# Patient Record
Sex: Female | Born: 1995 | Race: Black or African American | Hispanic: No | Marital: Single | Smoking: Former smoker
Health system: Southern US, Community
[De-identification: ages and names within clinical notes are randomized; demographics above are authoritative.]

## PROBLEM LIST (undated history)

## (undated) DIAGNOSIS — E079 Disorder of thyroid, unspecified: Secondary | ICD-10-CM

---
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
## 2015-09-10 ENCOUNTER — Emergency Department
Admission: EM | Admit: 2015-09-10 | Discharge: 2015-09-10 | Disposition: A | Discharge status: Home / Self Care | Payer: BLUE CROSS/BLUE SHIELD | Attending: Emergency Medicine | Admitting: Emergency Medicine

## 2015-09-10 ENCOUNTER — Encounter: Payer: Self-pay | Admitting: Emergency Medicine

## 2015-09-10 DIAGNOSIS — Y92410 Unspecified street and highway as the place of occurrence of the external cause: Secondary | ICD-10-CM | POA: Diagnosis not present

## 2015-09-10 DIAGNOSIS — F17200 Nicotine dependence, unspecified, uncomplicated: Secondary | ICD-10-CM | POA: Insufficient documentation

## 2015-09-10 DIAGNOSIS — Y998 Other external cause status: Secondary | ICD-10-CM | POA: Diagnosis not present

## 2015-09-10 DIAGNOSIS — Y9389 Activity, other specified: Secondary | ICD-10-CM | POA: Diagnosis not present

## 2015-09-10 DIAGNOSIS — S139XXA Sprain of joints and ligaments of unspecified parts of neck, initial encounter: Secondary | ICD-10-CM

## 2015-09-10 DIAGNOSIS — S134XXA Sprain of ligaments of cervical spine, initial encounter: Secondary | ICD-10-CM | POA: Diagnosis not present

## 2015-09-10 DIAGNOSIS — S199XXA Unspecified injury of neck, initial encounter: Secondary | ICD-10-CM | POA: Diagnosis present

## 2015-09-10 HISTORY — DX: Disorder of thyroid, unspecified: E07.9

## 2015-09-10 NOTE — Discharge Instructions (Signed)
Cervical Sprain  A cervical sprain is an injury in the neck in which the strong, fibrous tissues (ligaments) that connect your neck bones stretch or tear. Cervical sprains can range from mild to severe. Severe cervical sprains can cause the neck vertebrae to be unstable. This can lead to damage of the spinal cord and can result in serious nervous system problems. The amount of time it takes for a cervical sprain to get better depends on the cause and extent of the injury. Most cervical sprains heal in 1 to 3 weeks.  CAUSES   Severe cervical sprains may be caused by:    Contact sport injuries (such as from football, rugby, wrestling, hockey, auto racing, gymnastics, diving, martial arts, or boxing).    Motor vehicle collisions.    Whiplash injuries. This is an injury from a sudden forward and backward whipping movement of the head and neck.   Falls.   Mild cervical sprains may be caused by:    Being in an awkward position, such as while cradling a telephone between your ear and shoulder.    Sitting in a chair that does not offer proper support.    Working at a poorly designed computer station.    Looking up or down for long periods of time.   SYMPTOMS    Pain, soreness, stiffness, or a burning sensation in the front, back, or sides of the neck. This discomfort may develop immediately after the injury or slowly, 24 hours or more after the injury.    Pain or tenderness directly in the middle of the back of the neck.    Shoulder or upper back pain.    Limited ability to move the neck.    Headache.    Dizziness.    Weakness, numbness, or tingling in the hands or arms.    Muscle spasms.    Difficulty swallowing or chewing.    Tenderness and swelling of the neck.   DIAGNOSIS   Most of the time your health care provider can diagnose a cervical sprain by taking your history and doing a physical exam. Your health care provider will ask about previous neck injuries and any known neck  problems, such as arthritis in the neck. X-rays may be taken to find out if there are any other problems, such as with the bones of the neck. Other tests, such as a CT scan or MRI, may also be needed.   TREATMENT   Treatment depends on the severity of the cervical sprain. Mild sprains can be treated with rest, keeping the neck in place (immobilization), and pain medicines. Severe cervical sprains are immediately immobilized. Further treatment is done to help with pain, muscle spasms, and other symptoms and may include:   Medicines, such as pain relievers, numbing medicines, or muscle relaxants.    Physical therapy. This may involve stretching exercises, strengthening exercises, and posture training. Exercises and improved posture can help stabilize the neck, strengthen muscles, and help stop symptoms from returning.   HOME CARE INSTRUCTIONS    Put ice on the injured area.     Put ice in a plastic bag.     Place a towel between your skin and the bag.     Leave the ice on for 15-20 minutes, 3-4 times a day.    If your injury was severe, you may have been given a cervical collar to wear. A cervical collar is a two-piece collar designed to keep your neck from moving while it heals.      Do not remove the collar unless instructed by your health care provider.    If you have long hair, keep it outside of the collar.    Ask your health care provider before making any adjustments to your collar. Minor adjustments may be required over time to improve comfort and reduce pressure on your chin or on the back of your head.    Ifyou are allowed to remove the collar for cleaning or bathing, follow your health care provider's instructions on how to do so safely.    Keep your collar clean by wiping it with mild soap and water and drying it completely. If the collar you have been given includes removable pads, remove them every 1-2 days and hand wash them with soap and water. Allow them to air dry. They should be completely  dry before you wear them in the collar.    If you are allowed to remove the collar for cleaning and bathing, wash and dry the skin of your neck. Check your skin for irritation or sores. If you see any, tell your health care provider.    Do not drive while wearing the collar.    Only take over-the-counter or prescription medicines for pain, discomfort, or fever as directed by your health care provider.    Keep all follow-up appointments as directed by your health care provider.    Keep all physical therapy appointments as directed by your health care provider.    Make any needed adjustments to your workstation to promote good posture.    Avoid positions and activities that make your symptoms worse.    Warm up and stretch before being active to help prevent problems.   SEEK MEDICAL CARE IF:    Your pain is not controlled with medicine.    You are unable to decrease your pain medicine over time as planned.    Your activity level is not improving as expected.   SEEK IMMEDIATE MEDICAL CARE IF:    You develop any bleeding.   You develop stomach upset.   You have signs of an allergic reaction to your medicine.    Your symptoms get worse.    You develop new, unexplained symptoms.    You have numbness, tingling, weakness, or paralysis in any part of your body.   MAKE SURE YOU:    Understand these instructions.   Will watch your condition.   Will get help right away if you are not doing well or get worse.     This information is not intended to replace advice given to you by your health care provider. Make sure you discuss any questions you have with your health care provider.     Document Released: 03/26/2007 Document Revised: 06/03/2013 Document Reviewed: 12/04/2012  Elsevier Interactive Patient Education 2016 [employer]
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
## 2018-01-12 ENCOUNTER — Emergency Department: Payer: Self-pay

## 2018-01-12 ENCOUNTER — Emergency Department
Admission: EM | Admit: 2018-01-12 | Discharge: 2018-01-12 | Disposition: A | Discharge status: Home / Self Care | Payer: Self-pay | Attending: Emergency Medicine | Admitting: Emergency Medicine

## 2018-01-12 ENCOUNTER — Encounter: Payer: Self-pay | Admitting: Emergency Medicine

## 2018-01-12 ENCOUNTER — Other Ambulatory Visit: Payer: Self-pay

## 2018-01-12 DIAGNOSIS — Z87891 Personal history of nicotine dependence: Secondary | ICD-10-CM | POA: Insufficient documentation

## 2018-01-12 DIAGNOSIS — E079 Disorder of thyroid, unspecified: Secondary | ICD-10-CM | POA: Insufficient documentation

## 2018-01-12 DIAGNOSIS — R42 Dizziness and giddiness: Secondary | ICD-10-CM | POA: Insufficient documentation

## 2018-01-12 DIAGNOSIS — R569 Unspecified convulsions: Secondary | ICD-10-CM | POA: Insufficient documentation

## 2018-01-12 DIAGNOSIS — F1210 Cannabis abuse, uncomplicated: Secondary | ICD-10-CM | POA: Insufficient documentation

## 2018-01-12 DIAGNOSIS — Z79899 Other long term (current) drug therapy: Secondary | ICD-10-CM | POA: Insufficient documentation

## 2018-01-12 DIAGNOSIS — R1110 Vomiting, unspecified: Secondary | ICD-10-CM | POA: Insufficient documentation

## 2018-01-12 LAB — URINALYSIS, COMPLETE (UACMP) WITH MICROSCOPIC
BACTERIA UA: NONE SEEN
BILIRUBIN URINE: NEGATIVE
Glucose, UA: NEGATIVE mg/dL
HGB URINE DIPSTICK: NEGATIVE
KETONES UR: NEGATIVE mg/dL
NITRITE: NEGATIVE
PROTEIN: NEGATIVE mg/dL
Specific Gravity, Urine: 1.024 (ref 1.005–1.030)
pH: 5 (ref 5.0–8.0)

## 2018-01-12 LAB — BASIC METABOLIC PANEL
Anion gap: 6 (ref 5–15)
BUN: 13 mg/dL (ref 6–20)
CO2: 22 mmol/L (ref 22–32)
Calcium: 8.1 mg/dL — ABNORMAL LOW (ref 8.9–10.3)
Chloride: 113 mmol/L — ABNORMAL HIGH (ref 98–111)
Creatinine, Ser: 0.99 mg/dL (ref 0.44–1.00)
GFR calc Af Amer: >60 mL/min (ref >60)
GFR calc non Af Amer: >60 mL/min (ref >60)
GLUCOSE: 120 mg/dL — AB (ref 70–99)
POTASSIUM: 3.8 mmol/L (ref 3.5–5.1)
Sodium: 141 mmol/L (ref 135–145)

## 2018-01-12 LAB — CBC
HCT: 37 % (ref 35.0–47.0)
Hemoglobin: 11.6 g/dL — ABNORMAL LOW (ref 12.0–16.0)
MCH: 22.5 pg — ABNORMAL LOW (ref 26.0–34.0)
MCHC: 31.3 g/dL — ABNORMAL LOW (ref 32.0–36.0)
MCV: 71.9 fL — ABNORMAL LOW (ref 80.0–100.0)
Platelets: 374 10*3/uL (ref 150–440)
RBC: 5.15 MIL/uL (ref 3.80–5.20)
RDW: 16.9 % — ABNORMAL HIGH (ref 11.5–14.5)
WBC: 12.6 10*3/uL — ABNORMAL HIGH (ref 3.6–11.0)

## 2018-01-12 LAB — TSH: TSH: 9.479 u[IU]/mL — AB (ref 0.350–4.500)

## 2018-01-12 LAB — URINE DRUG SCREEN, QUALITATIVE (ARMC ONLY)
Amphetamines, Ur Screen: NOT DETECTED
Barbiturates, Ur Screen: NOT DETECTED
Cannabinoid 50 Ng, Ur ~~LOC~~: POSITIVE — AB
Cocaine Metabolite,Ur ~~LOC~~: NOT DETECTED
MDMA (ECSTASY) UR SCREEN: NOT DETECTED
Methadone Scn, Ur: NOT DETECTED
Opiate, Ur Screen: NOT DETECTED
Phencyclidine (PCP) Ur S: NOT DETECTED
Tricyclic, Ur Screen: NOT DETECTED

## 2018-01-12 LAB — POC URINE PREG, ED: Preg Test, Ur: NEGATIVE

## 2018-01-12 LAB — TROPONIN I: Troponin I: <0.03 ng/mL (ref <0.03)

## 2018-01-12 MED ORDER — SODIUM CHLORIDE 0.9 % IV BOLUS: 1000.0000 mL | Freq: Once | INTRAVENOUS | Status: DC

## 2018-01-12 NOTE — ED Triage Notes (Signed)
Pt BIB [facility] from friends house for seizure and vomiting. Pt states that she donoted plasma this morning around 1130, went to a friends house and smokes weed. Pt's friends reported to EMS that pt vomited and then her eyes rolled in the back of her head and postured and was jerking. EMS denies that pt was post ictal upon their arrival but states that pt was diaphoretic. Pt is currently A & O x 4, pt denies any current complaints. Pt denies hx/o seizures.   Vital signs with EMS as follows   Blood Pressure- 100/70 HR- 76 CBG- 131  SpO2- 99% on RA

## 2018-01-12 NOTE — ED Provider Notes (Signed)
[facility] Emergency Department Provider Note ____________________________________________   First MD Initiated Contact with Patient 01/12/18 1506     (approximate)  I have reviewed the triage vital signs and the nursing notes.   HISTORY  Chief Complaint Seizures  HPI Marilyn Peterson is a 22 y.o. female with a history of thyroid disease as well as regular marijuana use was presented to the emergency department after convulsive episodes x2 after donating plasma this morning.  The patient says that she donated plasma this morning and is smoked marijuana thereafter.  She says that she has been eating and drinking normally lately.  Says that she has donated plasma before and is gotten dizzy but is never passed out or had a seizure.  No family history of seizure disorder.  Patient has not had any alcoholic drinks in the past 24 hours.  Patient states that she began to feel dizzy and then a friend who was present said that the patient went limp and then had generalized tonic-clonic activity for about 2 minutes.  Patient then regained consciousness and then had another 2 minutes of generalized tonic-clonic activity.  The patient did not have any blood from her mouth.  Bystanders said that it looks like her eyes were rolling back.  Patient did not lose bowel or bladder continence.  Says that she has mild pressure to the front of her head at this time but no other complaints.  Does not report any weakness or numbness.  Bystanders also said that the witness the patient vomiting before she sees the first time.   Past Medical History:  Diagnosis Date  . Thyroid disease     There are no active problems to display for this patient.   History reviewed. No pertinent surgical history.  Prior to Admission medications   Medication Sig Start Date End Date Taking? Authorizing Provider  levothyroxine (SYNTHROID, LEVOTHROID) 100 MCG tablet Take 100 mcg by mouth daily. 12/06/16    [provider]    Allergies Patient has no known allergies.  No family history on file.  Social History Social History   Tobacco Use  . Smoking status: Former Games developermoker  . Smokeless tobacco: Never Used  Substance Use Topics  . Alcohol use: Yes    Alcohol/week: 1.2 oz    Types: 2 Shots of liquor per week  . Drug use: Yes    Types: Marijuana    Review of Systems  Constitutional: No fever/chills Eyes: No visual changes. ENT: No sore throat. Cardiovascular: Denies chest pain. Respiratory: Denies shortness of breath. Gastrointestinal: No abdominal pain.  No diarrhea.  No constipation. Genitourinary: Negative for dysuria. Musculoskeletal: Negative for back pain. Skin: Negative for rash. Neurological: Negative for focal weakness or numbness.   ____________________________________________   PHYSICAL EXAM:  VITAL SIGNS: ED Triage Vitals  Enc Vitals Group     BP 01/12/18 1438 110/81     Pulse Rate 01/12/18 1438 88     Resp 01/12/18 1438 19     Temp 01/12/18 1438 98.5 F (36.9 C)     Temp Source 01/12/18 1438 Oral     SpO2 01/12/18 1438 98 %     Weight 01/12/18 1440 297 lb (134.7 kg)     Height 01/12/18 1440 5\' 7"  (1.702 m)     Head Circumference --      Peak Flow --      Pain Score 01/12/18 1440 0     Pain Loc --  Pain Edu? --      Excl. in GC? --     Constitutional: Alert and oriented. Well appearing and in no acute distress. Eyes: Conjunctivae are normal.  Head: Atraumatic. Nose: No congestion/rhinnorhea. Mouth/Throat: Mucous membranes are moist.  Patient appears to have bilateral tongue abrasions.  No active bleeding.  Abrasions are superficial.  No exudate or swelling.  No erythema. Neck: No stridor.   Cardiovascular: Normal rate, regular rhythm. Grossly normal heart sounds. Respiratory: Normal respiratory effort.  No retractions. Lungs CTAB. Gastrointestinal: Soft and nontender. No distention.  Musculoskeletal: No lower extremity tenderness  nor edema.  No joint effusions. Neurologic:  Normal speech and language. No gross focal neurologic deficits are appreciated. Skin:  Skin is warm, dry and intact. No rash noted. Psychiatric: Mood and affect are normal. Speech and behavior are normal.  ____________________________________________   LABS (all labs ordered are listed, but only abnormal results are displayed)  Labs Reviewed  BASIC METABOLIC PANEL - Abnormal; Notable for the following components:      Result Value   Chloride 113 (*)    Glucose, Bld 120 (*)    Calcium 8.1 (*)    All other components within normal limits  CBC - Abnormal; Notable for the following components:   WBC 12.6 (*)    Hemoglobin 11.6 (*)    MCV 71.9 (*)    MCH 22.5 (*)    MCHC 31.3 (*)    RDW 16.9 (*)    All other components within normal limits  URINE DRUG SCREEN, QUALITATIVE ([facility] ONLY) - Abnormal; Notable for the following components:   Cannabinoid 50 Ng, Ur [place] POSITIVE (*)    Benzodiazepine, Ur Scrn TEST NOT PERFORMED, REAGENT NOT AVAILABLE (*)    All other components within normal limits  URINALYSIS, COMPLETE (UACMP) WITH MICROSCOPIC - Abnormal; Notable for the following components:   Color, Urine YELLOW (*)    APPearance HAZY (*)    Leukocytes, UA TRACE (*)    All other components within normal limits  TSH - Abnormal; Notable for the following components:   TSH 9.479 (*)    All other components within normal limits  TROPONIN I  POC URINE PREG, ED  CBG MONITORING, ED   ____________________________________________  EKG  ED ECG REPORT I, Arelia Longest, the attending physician, personally viewed and interpreted this ECG.   Date: 01/12/2018  EKG Time: 1438  Rate: 87  Rhythm: normal sinus rhythm  Axis: Normal  Intervals:none  ST&T Change: No ST segment elevation or depression.  No abnormal T wave inversion.  ____________________________________________  RADIOLOGY  CT scan without any acute  abnormality. ____________________________________________   PROCEDURES  Procedure(s) performed:   Procedures  Critical Care performed:   ____________________________________________   INITIAL IMPRESSION / ASSESSMENT AND PLAN / ED COURSE  Pertinent labs & imaging results that were available during my care of the patient were reviewed by me and considered in my medical decision making (see chart for details).  DDX: Seizure, convulsive syncope, dehydration, polysubstance abuse and vomiting, vasovagal episode As part of my medical decision making, I reviewed the following data within the electronic MEDICAL RECORD NUMBER Notes from prior outpatient records  ----------------------------------------- 6:06 PM on 01/12/2018 -----------------------------------------  Reassuring work-up.  Will send urine for culture.  Several white blood cells found.  However, the patient does not have any burning or frequency.  Seizure likely provoked by plasma donation and then marijuana smoking.  Discussed the 2 episodes witnessed today with neurology and Dr. Loretha Brasil  agrees that we will not start medication at this time for seizures.  Patient will require follow-up in the office for MRI as well as EEG.  She does not drive.  We discussed also taking precautions such as sitting down and resting if she feels lightheaded or similar symptoms to what she experienced a prior to these episodes.  We also discussed other activities that could be dangerous such as riding a bike or swimming.  Patient understanding of the plan as well as the diagnosis and willing to comply. ____________________________________________   FINAL CLINICAL IMPRESSION(S) / ED DIAGNOSES  Seizure like activity.  NEW MEDICATIONS STARTED DURING THIS VISIT:  New Prescriptions   No medications on file     Note:  This document was prepared using Dragon voice recognition software and may include unintentional dictation errors.     Myrna Blazer, MD 01/12/18 [phone]

## 2018-01-12 NOTE — ED Notes (Signed)
Pt informed that we need a urine sample, pt states that she is unable to urinate at this time.

## 2018-01-14 LAB — URINE CULTURE

## 2018-01-15 NOTE — Progress Notes (Signed)
Pharmacy Antibiotic Note  Marilyn Peterson is a 22 y.o. female seen in the ED on 01/12/2018 with seizures.  Her urine has grown grown out Coag-negative staph since she was discharged. I spoke with Ms Marilyn Peterson this morning and she is having no symptoms of a UTI (no fever, N/V, dysuria). I reviewed her chart and findings with Dr Marilyn Peterson who feels this is a contaminant,  Plan: We will defer treatment at this time. I spoke again to Ms Marilyn Peterson after the discussion with Dr Marilyn Peterson and told her it was felt by Dr Marilyn Peterson that this is a contaminant. I advised her that if she experiences symptoms in the next few days of a UTI to contact us. I provided my [employer] number since I am on this assignment the remainder of the week.  Height: 5\' 7"  (170.2 cm) Weight: 297 lb (134.7 kg) IBW/kg (Calculated) : 61.6  No data recorded.  Recent Labs  Lab 01/12/18 1446  WBC 12.6*  CREATININE 0.99    Estimated Creatinine Clearance: 125.6 mL/min (by C-G formula based on SCr of 0.99 mg/dL).    No Known Allergies  Antimicrobials this admission: none  Microbiology results: 8/3 UCx: coag-negative staph  Thank you for allowing pharmacy to be a part of this patient's care.  Marilyn Peterson, PharmD 01/15/2018 1:14 PM

## 2020-02-17 IMAGING — CT CT HEAD W/O CM
3 series · 16 of 46 positions shown, 19 images · non-contrast
Comparison: None.

CLINICAL DATA: Seizure

EXAM:
CT HEAD WITHOUT CONTRAST
TECHNIQUE: Contiguous axial images were obtained from the base of the skull
through the vertex without intravenous contrast.

[Series 2: head wo · axial · 0.47mm/px · z∈[-185,-65]mm · 10 of 29 slices shown, 13 images]
[im 3/29  brain]
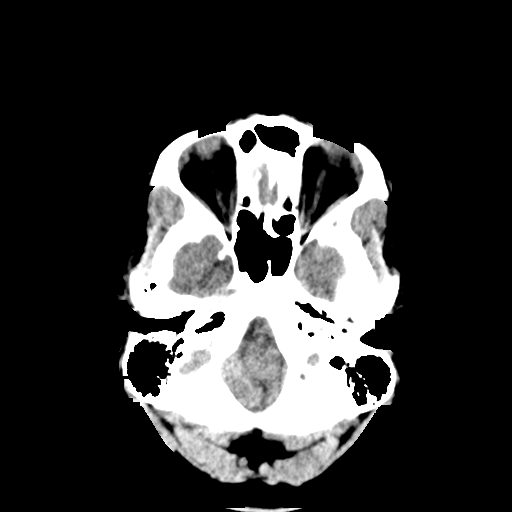
[im 3/29  bone]
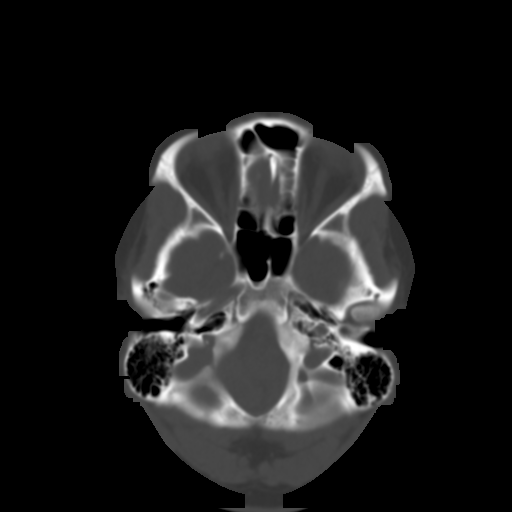
[im 6/29  brain]
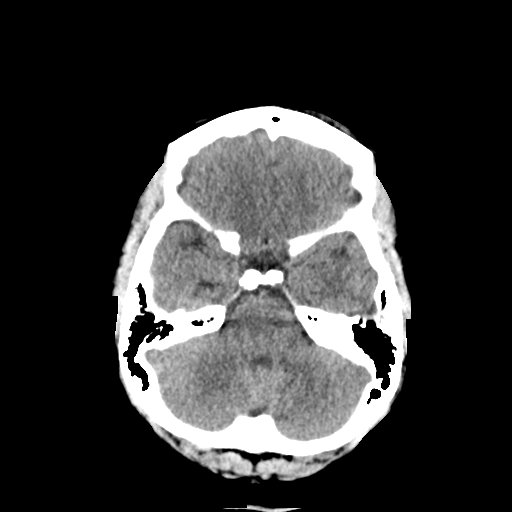
[im 8/29  brain]
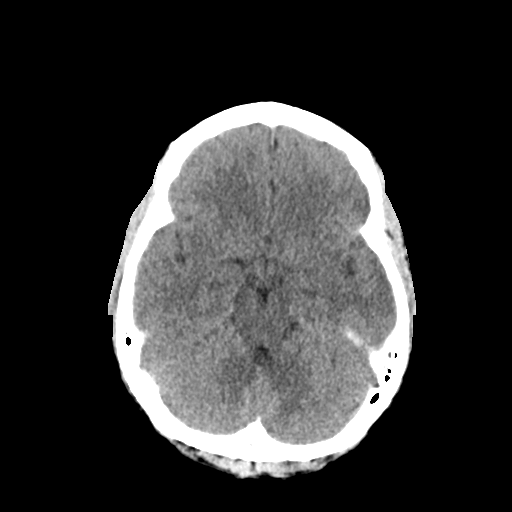
[im 11/29  brain]
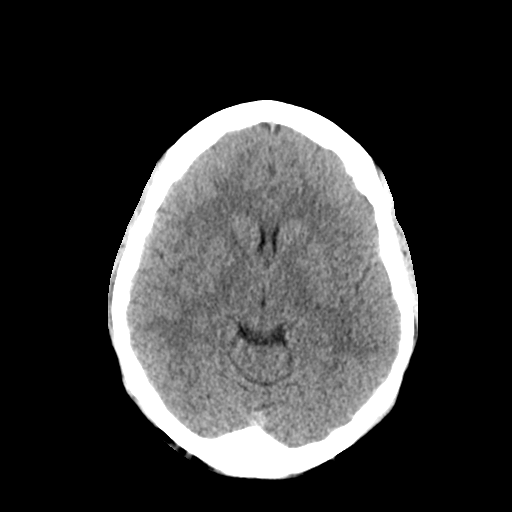
[im 14/29  brain]
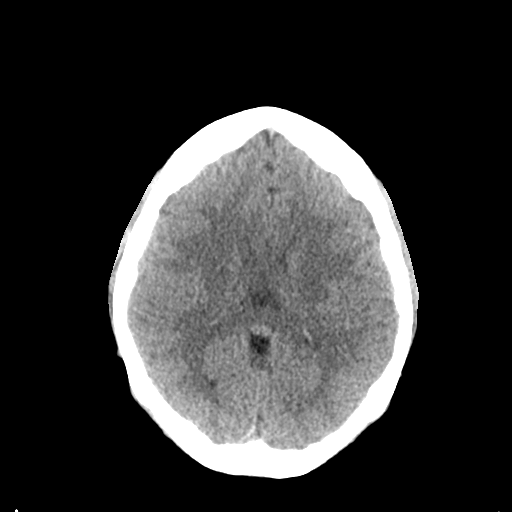
[im 14/29  bone]
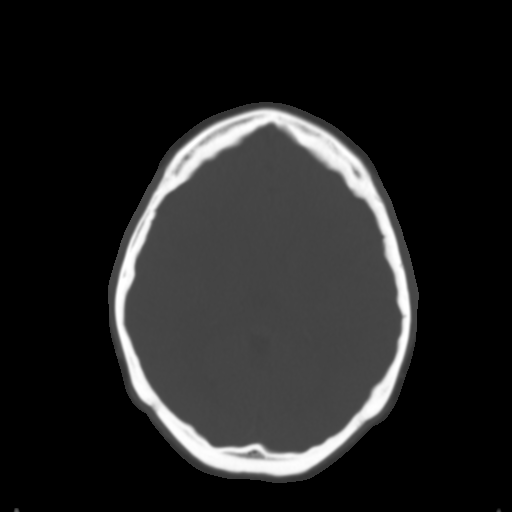
[im 16/29  brain]
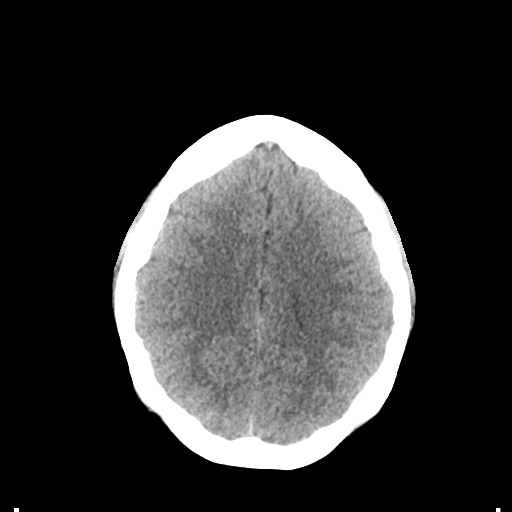
[im 19/29  brain]
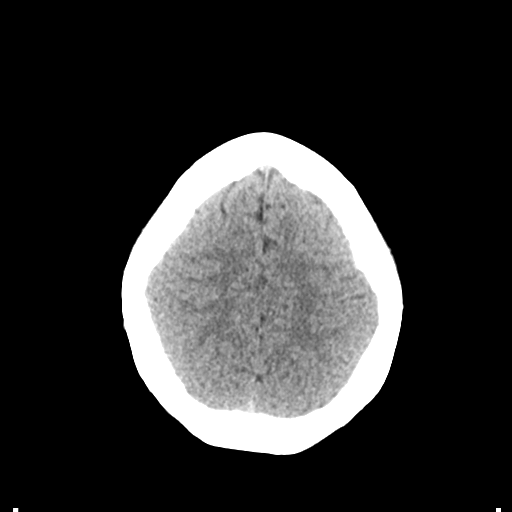
[im 22/29  brain]
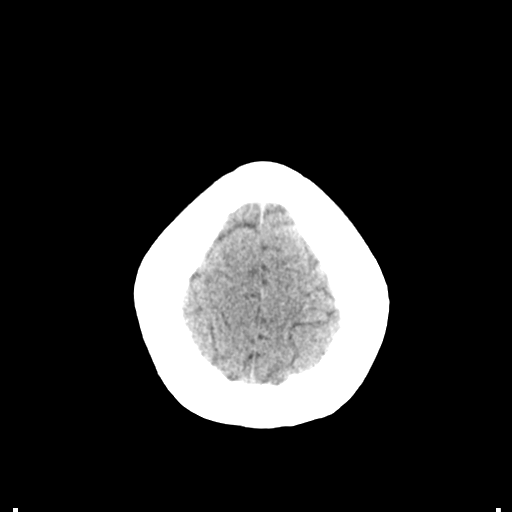
[im 24/29  brain]
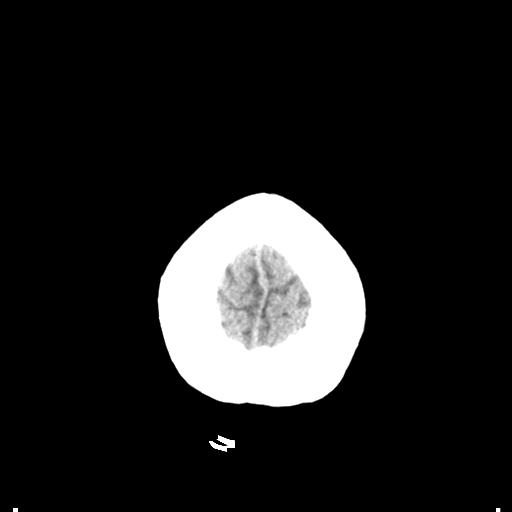
[im 24/29  bone]
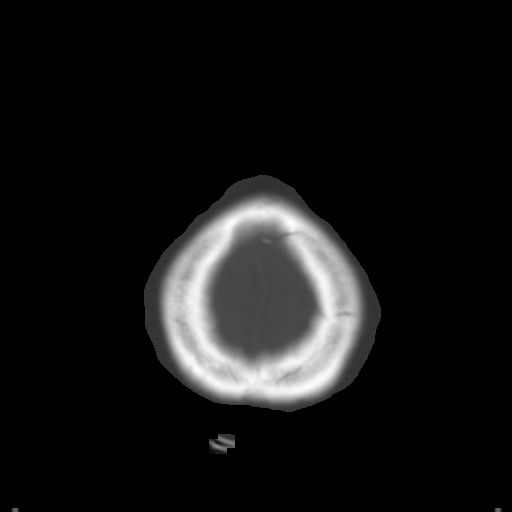
[im 27/29  brain]
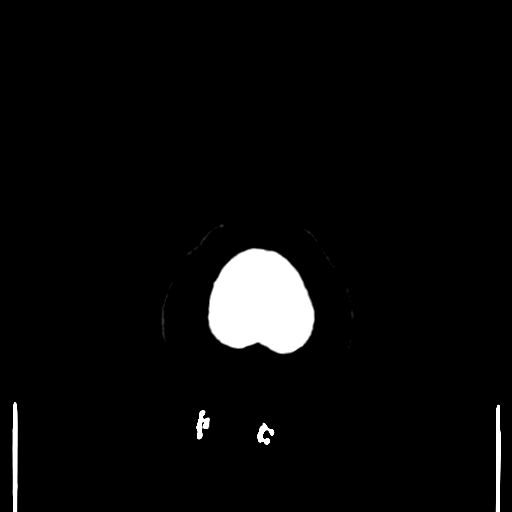

[Series 4: coronal soft tissue · coronal · 0.29mm/px · 3 of 64 slices shown]
[im 22/64  brain]
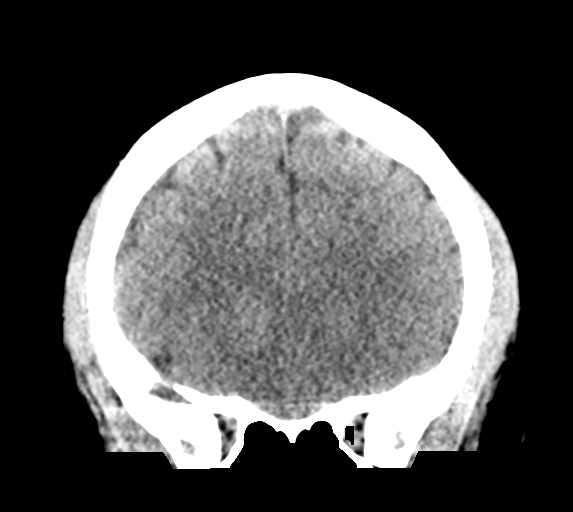
[im 29/64  brain]
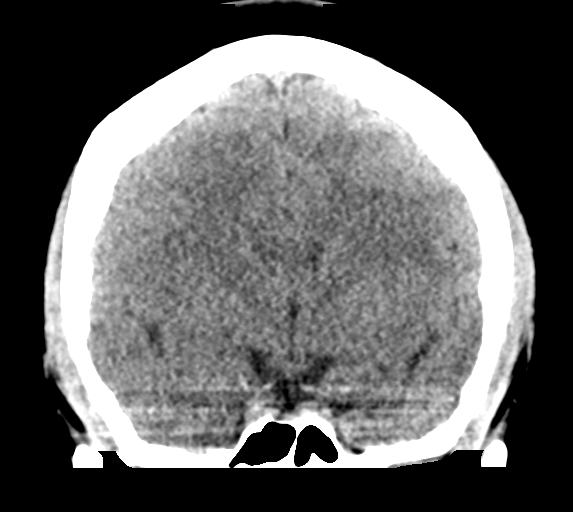
[im 36/64  brain]
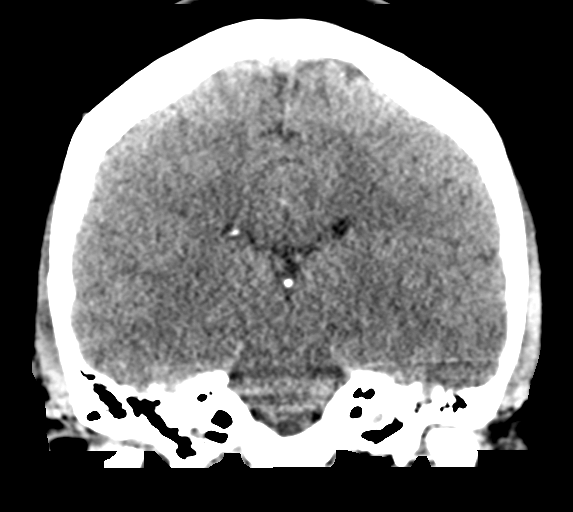

[Series 5: sagittal soft tissue · sagittal · 0.29mm/px · 3 of 57 slices shown]
[im 19/57  brain]
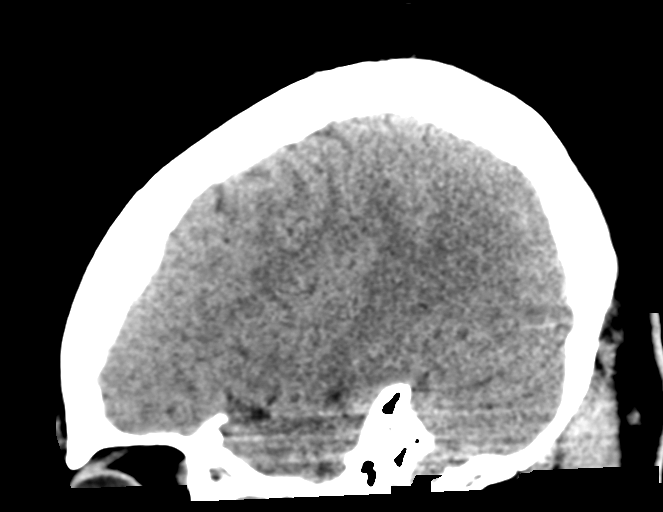
[im 29/57  brain]
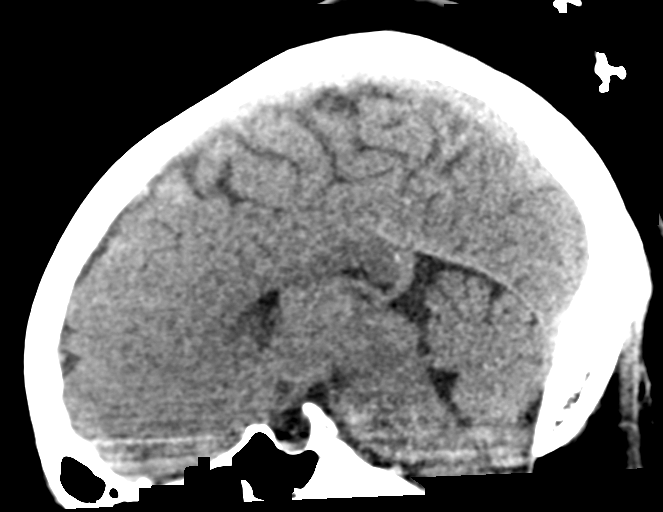
[im 38/57  brain]
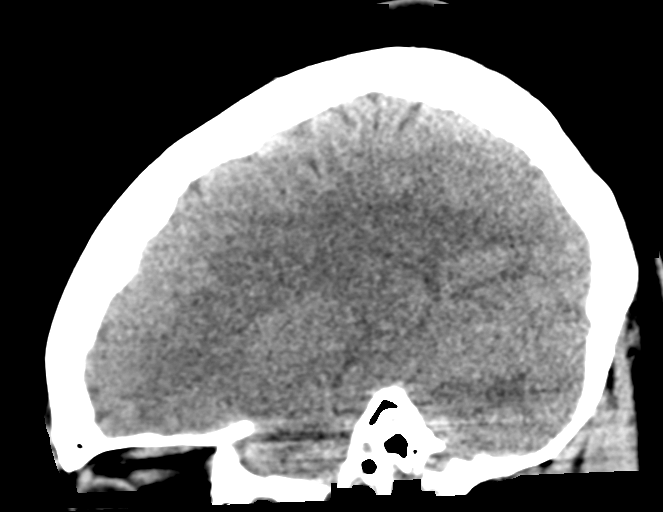

[16 of 46 positions shown; findings below may reference images not displayed]

FINDINGS: Brain: No evidence of acute infarction, hemorrhage, hydrocephalus,
extra-axial collection or mass lesion/mass effect.

Vascular: No hyperdense vessel or unexpected calcification.

Skull: Normal. Negative for fracture or focal lesion.

Sinuses/Orbits: The visualized paranasal sinuses are essentially
clear. The mastoid air cells are unopacified.

Other: None.
IMPRESSION: Normal head CT.

## 2022-05-12 DIAGNOSIS — Z419 Encounter for procedure for purposes other than remedying health state, unspecified: Secondary | ICD-10-CM | POA: Diagnosis not present

## 2022-06-12 DIAGNOSIS — Z419 Encounter for procedure for purposes other than remedying health state, unspecified: Secondary | ICD-10-CM | POA: Diagnosis not present

## 2022-07-13 DIAGNOSIS — Z419 Encounter for procedure for purposes other than remedying health state, unspecified: Secondary | ICD-10-CM | POA: Diagnosis not present

## 2022-08-11 DIAGNOSIS — Z419 Encounter for procedure for purposes other than remedying health state, unspecified: Secondary | ICD-10-CM | POA: Diagnosis not present

## 2022-09-11 DIAGNOSIS — Z419 Encounter for procedure for purposes other than remedying health state, unspecified: Secondary | ICD-10-CM | POA: Diagnosis not present

## 2022-10-11 DIAGNOSIS — Z419 Encounter for procedure for purposes other than remedying health state, unspecified: Secondary | ICD-10-CM | POA: Diagnosis not present

## 2022-11-11 DIAGNOSIS — Z419 Encounter for procedure for purposes other than remedying health state, unspecified: Secondary | ICD-10-CM | POA: Diagnosis not present

## 2022-12-11 DIAGNOSIS — Z419 Encounter for procedure for purposes other than remedying health state, unspecified: Secondary | ICD-10-CM | POA: Diagnosis not present

## 2023-01-11 DIAGNOSIS — Z419 Encounter for procedure for purposes other than remedying health state, unspecified: Secondary | ICD-10-CM | POA: Diagnosis not present

## 2023-02-11 DIAGNOSIS — Z419 Encounter for procedure for purposes other than remedying health state, unspecified: Secondary | ICD-10-CM | POA: Diagnosis not present

## 2023-03-13 DIAGNOSIS — Z419 Encounter for procedure for purposes other than remedying health state, unspecified: Secondary | ICD-10-CM | POA: Diagnosis not present

## 2023-04-13 DIAGNOSIS — Z419 Encounter for procedure for purposes other than remedying health state, unspecified: Secondary | ICD-10-CM | POA: Diagnosis not present

## 2023-05-13 DIAGNOSIS — Z419 Encounter for procedure for purposes other than remedying health state, unspecified: Secondary | ICD-10-CM | POA: Diagnosis not present

## 2023-06-13 DIAGNOSIS — Z419 Encounter for procedure for purposes other than remedying health state, unspecified: Secondary | ICD-10-CM | POA: Diagnosis not present

## 2023-07-14 DIAGNOSIS — Z419 Encounter for procedure for purposes other than remedying health state, unspecified: Secondary | ICD-10-CM | POA: Diagnosis not present

## 2023-08-11 DIAGNOSIS — Z419 Encounter for procedure for purposes other than remedying health state, unspecified: Secondary | ICD-10-CM | POA: Diagnosis not present

## 2023-09-22 DIAGNOSIS — Z419 Encounter for procedure for purposes other than remedying health state, unspecified: Secondary | ICD-10-CM | POA: Diagnosis not present

## 2023-10-22 DIAGNOSIS — Z419 Encounter for procedure for purposes other than remedying health state, unspecified: Secondary | ICD-10-CM | POA: Diagnosis not present

## 2023-11-22 DIAGNOSIS — Z419 Encounter for procedure for purposes other than remedying health state, unspecified: Secondary | ICD-10-CM | POA: Diagnosis not present
